# Patient Record
Sex: Female | Born: 1975
Health system: Southern US, Community
[De-identification: ages and names within clinical notes are randomized; demographics above are authoritative.]

## PROBLEM LIST (undated history)

## (undated) DIAGNOSIS — E039 Hypothyroidism, unspecified: Secondary | ICD-10-CM

## (undated) DIAGNOSIS — E063 Autoimmune thyroiditis: Secondary | ICD-10-CM

---

## 2019-06-19 ENCOUNTER — Ambulatory Visit (INDEPENDENT_AMBULATORY_CARE_PROVIDER_SITE_OTHER): Payer: 59

## 2019-06-19 ENCOUNTER — Ambulatory Visit (HOSPITAL_COMMUNITY)
Admission: EM | Admit: 2019-06-19 | Discharge: 2019-06-19 | Disposition: A | Discharge status: Home / Self Care | Payer: 59 | Attending: Emergency Medicine | Admitting: Emergency Medicine

## 2019-06-19 ENCOUNTER — Encounter (HOSPITAL_COMMUNITY): Payer: Self-pay | Admitting: Emergency Medicine

## 2019-06-19 DIAGNOSIS — S93492A Sprain of other ligament of left ankle, initial encounter: Secondary | ICD-10-CM

## 2019-06-19 DIAGNOSIS — S93602A Unspecified sprain of left foot, initial encounter: Secondary | ICD-10-CM | POA: Diagnosis not present

## 2019-06-19 DIAGNOSIS — S99922A Unspecified injury of left foot, initial encounter: Secondary | ICD-10-CM | POA: Diagnosis not present

## 2019-06-19 DIAGNOSIS — L989 Disorder of the skin and subcutaneous tissue, unspecified: Secondary | ICD-10-CM | POA: Diagnosis not present

## 2019-06-19 DIAGNOSIS — S91312A Laceration without foreign body, left foot, initial encounter: Secondary | ICD-10-CM | POA: Diagnosis not present

## 2019-06-19 DIAGNOSIS — S99912A Unspecified injury of left ankle, initial encounter: Secondary | ICD-10-CM | POA: Diagnosis not present

## 2019-06-19 HISTORY — DX: Autoimmune thyroiditis: E06.3

## 2019-06-19 HISTORY — DX: Hypothyroidism, unspecified: E03.9

## 2019-06-19 MED ORDER — KETOROLAC TROMETHAMINE 30 MG/ML IJ SOLN
INTRAMUSCULAR | Status: AC
  Filled 2019-06-19: qty 1

## 2019-06-19 MED ORDER — MUPIROCIN 2 % EX OINT: 1.0000 "application " | TOPICAL_OINTMENT | Freq: Three times a day (TID) | CUTANEOUS | 0 refills | Status: AC

## 2019-06-19 MED ORDER — IBUPROFEN 600 MG PO TABS: 600.0000 mg | ORAL_TABLET | Freq: Four times a day (QID) | ORAL | 0 refills | Status: AC | PRN

## 2019-06-19 MED ORDER — ACETAMINOPHEN 325 MG PO TABS
975.0000 mg | ORAL_TABLET | Freq: Once | ORAL | Status: AC
  Administered 2019-06-19: 975 mg via ORAL

## 2019-06-19 MED ORDER — ACETAMINOPHEN 325 MG PO TABS
ORAL_TABLET | ORAL | Status: AC
  Filled 2019-06-19: qty 3

## 2019-06-19 MED ORDER — KETOROLAC TROMETHAMINE 30 MG/ML IJ SOLN
30.0000 mg | Freq: Once | INTRAMUSCULAR | Status: AC
  Administered 2019-06-19: 30 mg via INTRAMUSCULAR

## 2019-06-19 MED FILL — MUPIROCIN 2% OINTMENT: 2 | 15 days supply | Qty: 22 | Fill #0

## 2019-06-19 MED FILL — IBUPROFEN 600 MG TABLET: 600 | 7 days supply | Qty: 30 | Fill #0
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
## 2019-06-19 NOTE — Discharge Instructions (Addendum)
Continue ice, elevation.  600 mg of ibuprofen combined with 1000 mg of Tylenol 3-4 times a day as needed for pain.  Wear the ASO as needed for comfort.  Apply the Bactroban and continue your essential oils and wound care on your hand and foot.  Follow-up with [facility] if your ankle and foot are still giving you problems in a week.  Please try and get some rest.

## 2019-10-23 ENCOUNTER — Other Ambulatory Visit (HOSPITAL_COMMUNITY): Payer: Self-pay | Admitting: Internal Medicine

## 2019-10-23 DIAGNOSIS — R002 Palpitations: Secondary | ICD-10-CM | POA: Diagnosis not present

## 2019-10-23 DIAGNOSIS — Z0000 Encounter for general adult medical examination without abnormal findings: Secondary | ICD-10-CM | POA: Diagnosis not present

## 2019-10-23 DIAGNOSIS — J45909 Unspecified asthma, uncomplicated: Secondary | ICD-10-CM | POA: Diagnosis not present

## 2019-10-23 DIAGNOSIS — E039 Hypothyroidism, unspecified: Secondary | ICD-10-CM | POA: Diagnosis not present

## 2019-10-23 DIAGNOSIS — R079 Chest pain, unspecified: Secondary | ICD-10-CM | POA: Diagnosis not present

## 2019-10-23 DIAGNOSIS — E041 Nontoxic single thyroid nodule: Secondary | ICD-10-CM | POA: Diagnosis not present

## 2019-10-23 MED FILL — PHENTERMINE 37.5 MG TABLET: 37.5 | 30 days supply | Qty: 30 | Fill #0

## 2019-10-23 MED FILL — ALBUTEROL SULFATE HFA 108 (: 108 (90 BAS | 50 days supply | Qty: 18 | Fill #0

## 2019-10-24 ENCOUNTER — Other Ambulatory Visit (HOSPITAL_COMMUNITY): Payer: Self-pay | Admitting: Internal Medicine

## 2019-10-24 DIAGNOSIS — R079 Chest pain, unspecified: Secondary | ICD-10-CM | POA: Diagnosis not present

## 2019-10-25 DIAGNOSIS — E042 Nontoxic multinodular goiter: Secondary | ICD-10-CM | POA: Diagnosis not present

## 2019-10-25 MED FILL — SYNTHROID 125 MCG TABLET: 125 | 90 days supply | Qty: 96 | Fill #0

## 2019-11-14 DIAGNOSIS — S39012A Strain of muscle, fascia and tendon of lower back, initial encounter: Secondary | ICD-10-CM | POA: Diagnosis not present

## 2019-11-14 DIAGNOSIS — M9901 Segmental and somatic dysfunction of cervical region: Secondary | ICD-10-CM | POA: Diagnosis not present

## 2019-11-14 DIAGNOSIS — M9903 Segmental and somatic dysfunction of lumbar region: Secondary | ICD-10-CM | POA: Diagnosis not present

## 2019-11-14 DIAGNOSIS — S134XXA Sprain of ligaments of cervical spine, initial encounter: Secondary | ICD-10-CM | POA: Diagnosis not present

## 2019-12-13 ENCOUNTER — Other Ambulatory Visit (HOSPITAL_COMMUNITY): Payer: Self-pay | Admitting: Internal Medicine

## 2019-12-13 DIAGNOSIS — E039 Hypothyroidism, unspecified: Secondary | ICD-10-CM | POA: Diagnosis not present

## 2019-12-13 DIAGNOSIS — Z7189 Other specified counseling: Secondary | ICD-10-CM | POA: Diagnosis not present

## 2019-12-13 DIAGNOSIS — Z6833 Body mass index (BMI) 33.0-33.9, adult: Secondary | ICD-10-CM | POA: Diagnosis not present

## 2019-12-13 MED FILL — PHENTERMINE 37.5 MG TABLET: 37.5 | 30 days supply | Qty: 30 | Fill #0

## 2019-12-29 ENCOUNTER — Encounter: Payer: Self-pay | Admitting: Physician Assistant

## 2020-01-03 ENCOUNTER — Ambulatory Visit (HOSPITAL_COMMUNITY)
Admission: RE | Admit: 2020-01-03 | Discharge: 2020-01-03 | Disposition: A | Discharge status: Home / Self Care | Payer: 59 | Source: Ambulatory Visit | Attending: Pulmonary Disease | Admitting: Pulmonary Disease

## 2020-01-03 ENCOUNTER — Encounter: Payer: Self-pay | Admitting: Oncology

## 2020-01-03 ENCOUNTER — Other Ambulatory Visit: Payer: Self-pay | Admitting: Oncology

## 2020-01-03 DIAGNOSIS — U071 COVID-19: Secondary | ICD-10-CM | POA: Diagnosis not present

## 2020-01-03 MED ORDER — SODIUM CHLORIDE 0.9 % IV SOLN
1200.0000 mg | Freq: Once | INTRAVENOUS | Status: AC
  Administered 2020-01-03: 1200 mg via INTRAVENOUS

## 2020-01-03 MED ORDER — FAMOTIDINE IN NACL 20-0.9 MG/50ML-% IV SOLN: 20.0000 mg | Freq: Once | INTRAVENOUS | Status: DC | PRN

## 2020-01-03 MED ORDER — ALBUTEROL SULFATE HFA 108 (90 BASE) MCG/ACT IN AERS: 2.0000 | INHALATION_SPRAY | Freq: Once | RESPIRATORY_TRACT | Status: DC | PRN

## 2020-01-03 MED ORDER — METHYLPREDNISOLONE SODIUM SUCC 125 MG IJ SOLR: 125.0000 mg | Freq: Once | INTRAMUSCULAR | Status: DC | PRN

## 2020-01-03 MED ORDER — SODIUM CHLORIDE 0.9 % IV SOLN: INTRAVENOUS | Status: DC | PRN

## 2020-01-03 MED ORDER — DIPHENHYDRAMINE HCL 50 MG/ML IJ SOLN: 50.0000 mg | Freq: Once | INTRAMUSCULAR | Status: DC | PRN

## 2020-01-03 MED ORDER — EPINEPHRINE 0.3 MG/0.3ML IJ SOAJ: 0.3000 mg | Freq: Once | INTRAMUSCULAR | Status: DC | PRN

## 2020-01-03 MED ORDER — SODIUM CHLORIDE 0.9 % IV SOLN: Freq: Once | INTRAVENOUS | Status: DC

## 2020-01-03 NOTE — Discharge Instructions (Signed)
10 Things You Can Do to Manage Your COVID-19 Symptoms at Home If you have possible or confirmed COVID-19: 1. Stay home from work and school. And stay away from other public places. If you must go out, avoid using any kind of public transportation, ridesharing, or taxis. 2. Monitor your symptoms carefully. If your symptoms get worse, call your healthcare provider immediately. 3. Get rest and stay hydrated. 4. If you have a medical appointment, call the healthcare provider ahead of time and tell them that you have or may have COVID-19. 5. For medical emergencies, call 911 and notify the dispatch personnel that you have or may have COVID-19. 6. Cover your cough and sneezes with a tissue or use the inside of your elbow. 7. Wash your hands often with soap and water for at least 20 seconds or clean your hands with an alcohol-based hand sanitizer that contains at least 60% alcohol. 8. As much as possible, stay in a specific room and away from other people in your home. Also, you should use a separate bathroom, if available. If you need to be around other people in or outside of the home, wear a mask. 9. Avoid sharing personal items with other people in your household, like dishes, towels, and bedding. 10. Clean all surfaces that are touched often, like counters, tabletops, and doorknobs. Use household cleaning sprays or wipes according to the label instructions. cdc.gov/coronavirus 07/25/2018 This information is not intended to replace advice given to you by your health care provider. Make sure you discuss any questions you have with your health care provider. Document Revised: 12/27/2018 Document Reviewed: 12/27/2018 Elsevier Patient Education  2020 Elsevier Inc. What types of side effects do monoclonal antibody drugs cause?  Common side effects  In general, the more common side effects caused by monoclonal antibody drugs include: . Allergic reactions, such as hives or itching . Flu-like signs and  symptoms, including chills, fatigue, fever, and muscle aches and pains . Nausea, vomiting . Diarrhea . Skin rashes . Low blood pressure   The CDC is recommending patients who receive monoclonal antibody treatments wait at least 90 days before being vaccinated.  Currently, there are no data on the safety and efficacy of mRNA COVID-19 vaccines in persons who received monoclonal antibodies or convalescent plasma as part of COVID-19 treatment. Based on the estimated half-life of such therapies as well as evidence suggesting that reinfection is uncommon in the 90 days after initial infection, vaccination should be deferred for at least 90 days, as a precautionary measure until additional information becomes available, to avoid interference of the antibody treatment with vaccine-induced immune responses. If you have any questions or concerns after the infusion please call the Advanced Practice Provider on call at [phone]. This number is ONLY intended for your use regarding questions or concerns about the infusion post-treatment side-effects.  Please do not provide this number to others for use. For return to work notes please contact your primary care provider.   If someone you know is interested in receiving treatment please have them call the COVID hotline at [phone].   

## 2020-01-03 NOTE — Progress Notes (Signed)
  Diagnosis: COVID-19  Physician: Patrick Wright, MD  Procedure: [facility] Infusion Clinic Med: casirivimab\imdevimab infusion - Provided patient with casirivimab\imdevimab fact sheet for patients, parents and caregivers prior to infusion.  Complications: No immediate complications noted.  Discharge: Discharged home   [name] N Geoff Dacanay 01/03/2020  

## 2020-01-03 NOTE — Progress Notes (Signed)
I connected by phone with  Kimberly Waller to discuss the potential use of an new treatment for mild to moderate COVID-19 viral infection in non-hospitalized patients.   This patient is a age/sex that meets the FDA criteria for Emergency Use Authorization of casirivimab\imdevimab.  Has a (+) direct SARS-CoV-2 viral test result 1. Has mild or moderate COVID-19  2. Is ? 45 years of age and weighs ? 40 kg 3. Is NOT hospitalized due to COVID-19 4. Is NOT requiring oxygen therapy or requiring an increase in baseline oxygen flow rate due to COVID-19 5. Is within 10 days of symptom onset 6. Has at least one of the high risk factor(s) for progression to severe COVID-19 and/or hospitalization as defined in EUA. Specific high risk criteria : Past Medical History:  Diagnosis Date  . Hashimoto's disease   . Hypothyroidism   ?  ?    Symptom onset  12/25/2019   I have spoken and communicated the following to the patient or parent/caregiver:   1. FDA has authorized the emergency use of casirivimab\imdevimab for the treatment of mild to moderate COVID-19 in adults and pediatric patients with positive results of direct SARS-CoV-2 viral testing who are 68 years of age and older weighing at least 40 kg, and who are at high risk for progressing to severe COVID-19 and/or hospitalization.   2. The significant known and potential risks and benefits of casirivimab\imdevimab, and the extent to which such potential risks and benefits are unknown.   3. Information on available alternative treatments and the risks and benefits of those alternatives, including clinical trials.   4. Patients treated with casirivimab\imdevimab should continue to self-isolate and use infection control measures (e.g., wear mask, isolate, social distance, avoid sharing personal items, clean and disinfect "high touch" surfaces, and frequent handwashing) according to CDC guidelines.    5. The patient or parent/caregiver has the option to  accept or refuse casirivimab\imdevimab .   After reviewing this information with the patient, The patient agreed to proceed with receiving casirivimab\imdevimab infusion and will be provided a copy of the Fact sheet prior to receiving the infusion.Mignon Pine, AGNP-C [phone] (Infusion Center Hotline)

## 2020-01-03 NOTE — Progress Notes (Signed)
Patient reviewed Fact Sheet for Patients, Parents, and Caregivers for Emergency Use Authorization (EUA) of REGEN-COV for the Treatment of Coronavirus. Patient also reviewed and is agreeable to the estimated cost of treatment. Patient is agreeable to proceed.    

## 2020-01-28 MED FILL — SYNTHROID 125 MCG TABLET: 125 | 90 days supply | Qty: 96 | Fill #1

## 2020-03-16 ENCOUNTER — Other Ambulatory Visit (HOSPITAL_COMMUNITY): Payer: Self-pay | Admitting: Internal Medicine

## 2020-03-16 DIAGNOSIS — E039 Hypothyroidism, unspecified: Secondary | ICD-10-CM | POA: Diagnosis not present

## 2020-03-16 DIAGNOSIS — Z7189 Other specified counseling: Secondary | ICD-10-CM | POA: Diagnosis not present

## 2020-03-16 DIAGNOSIS — M542 Cervicalgia: Secondary | ICD-10-CM | POA: Diagnosis not present

## 2020-03-16 DIAGNOSIS — Z6833 Body mass index (BMI) 33.0-33.9, adult: Secondary | ICD-10-CM | POA: Diagnosis not present

## 2020-03-16 DIAGNOSIS — Z6834 Body mass index (BMI) 34.0-34.9, adult: Secondary | ICD-10-CM | POA: Diagnosis not present

## 2020-03-16 MED FILL — CYCLOBENZAPRINE HCL 10 MG T: 10 | 5 days supply | Qty: 15 | Fill #0

## 2020-03-17 MED FILL — ALBUTEROL SULFATE HFA 108 (: 108 (90 BAS | 50 days supply | Qty: 18 | Fill #1

## 2020-03-17 MED FILL — PHENTERMINE 37.5 MG TABLET: 37.5 | 30 days supply | Qty: 30 | Fill #0

## 2020-06-10 ENCOUNTER — Other Ambulatory Visit (HOSPITAL_COMMUNITY): Payer: Self-pay

## 2020-06-10 MED FILL — Levothyroxine Sodium Tab 125 MCG: ORAL | 28 days supply | Qty: 32 | Fill #0 | Status: AC

## 2020-11-27 ENCOUNTER — Other Ambulatory Visit (HOSPITAL_COMMUNITY): Payer: Self-pay

## 2021-12-27 IMAGING — DX DG FOOT COMPLETE 3+V*L*
3 series · 3 of 3 positions shown · non-contrast
Comparison: None.

CLINICAL DATA: Distal fibular tenderness, fourth digit pain, trauma

EXAM:
LEFT FOOT - COMPLETE 3+ VIEW

[foot ap]
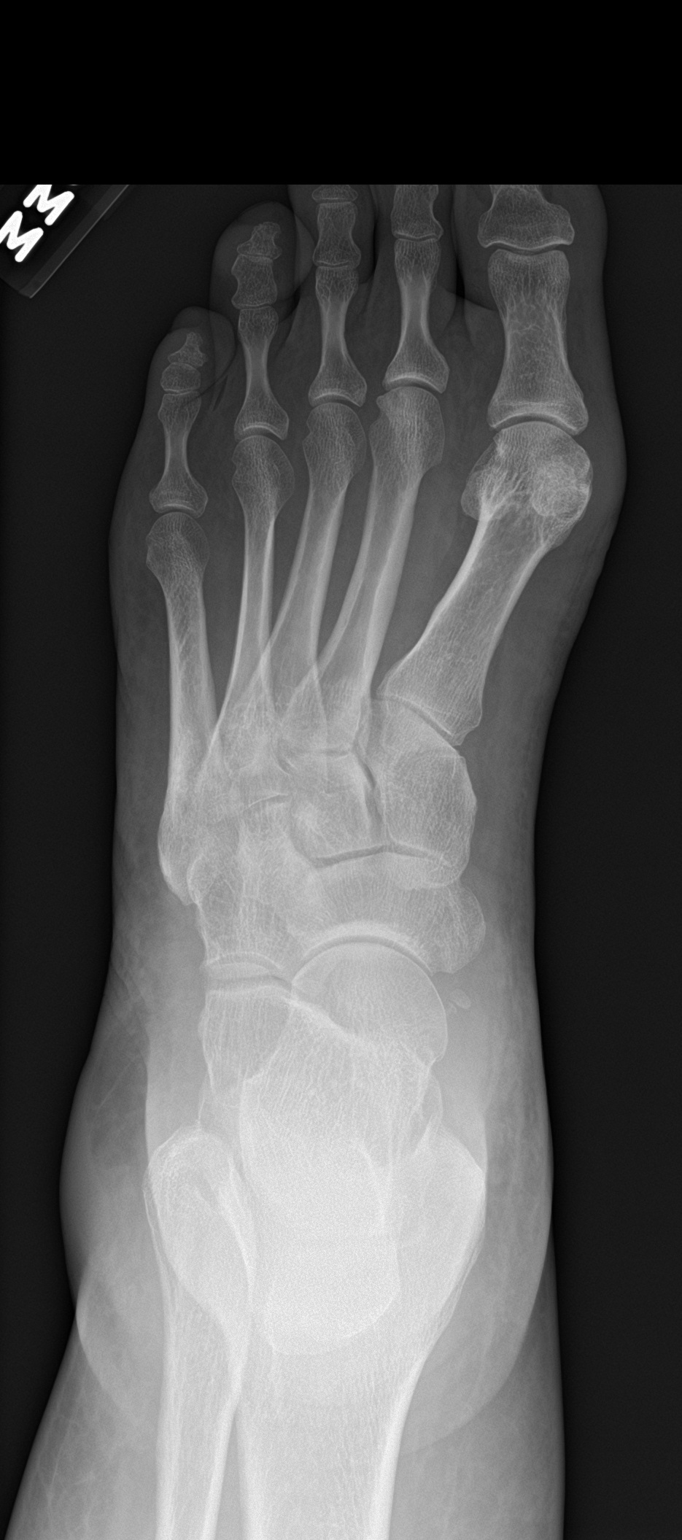

[foot obl]
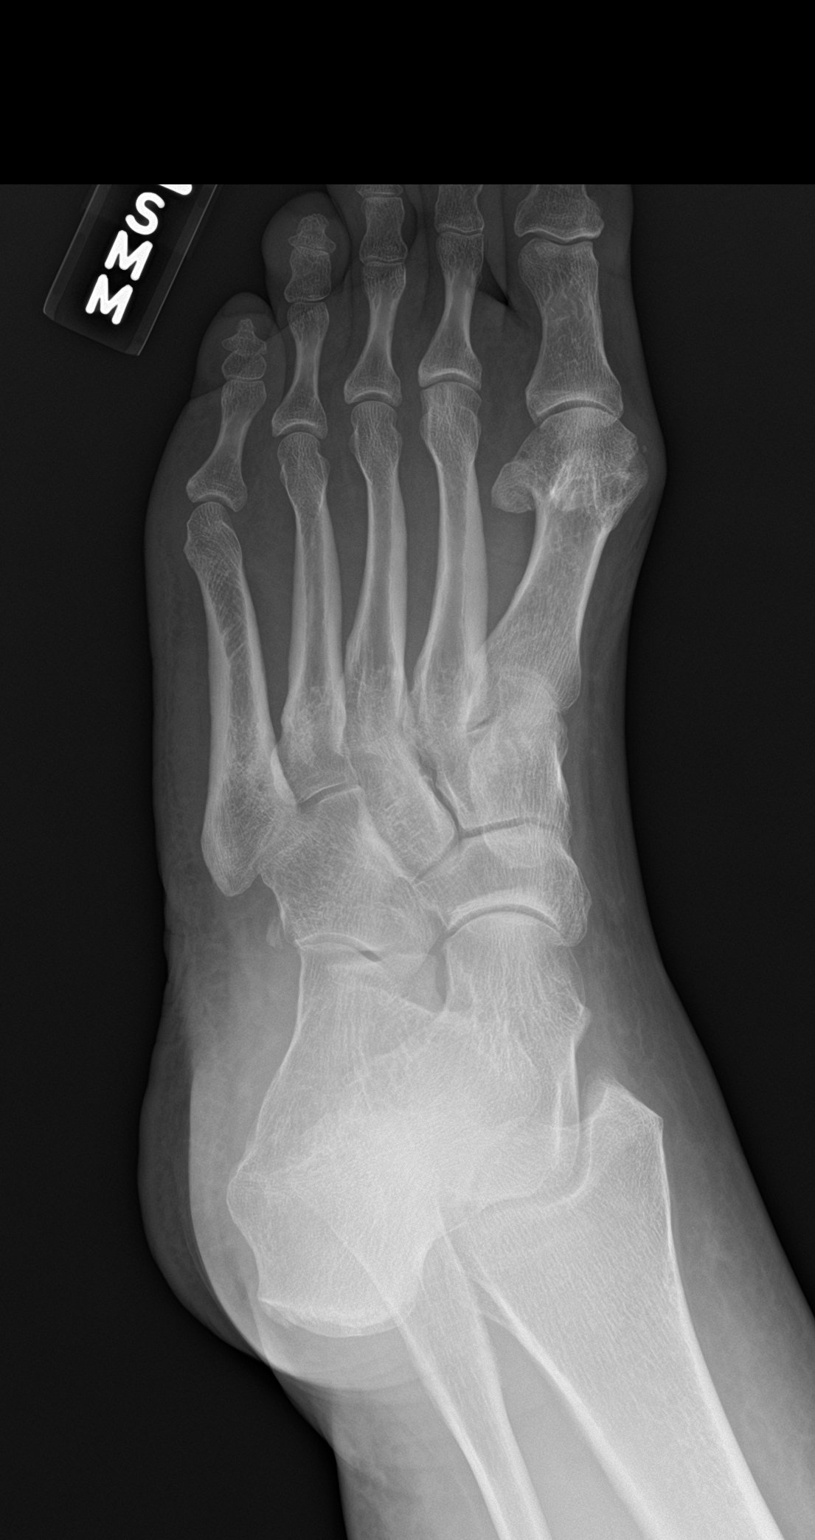

[foot lat]
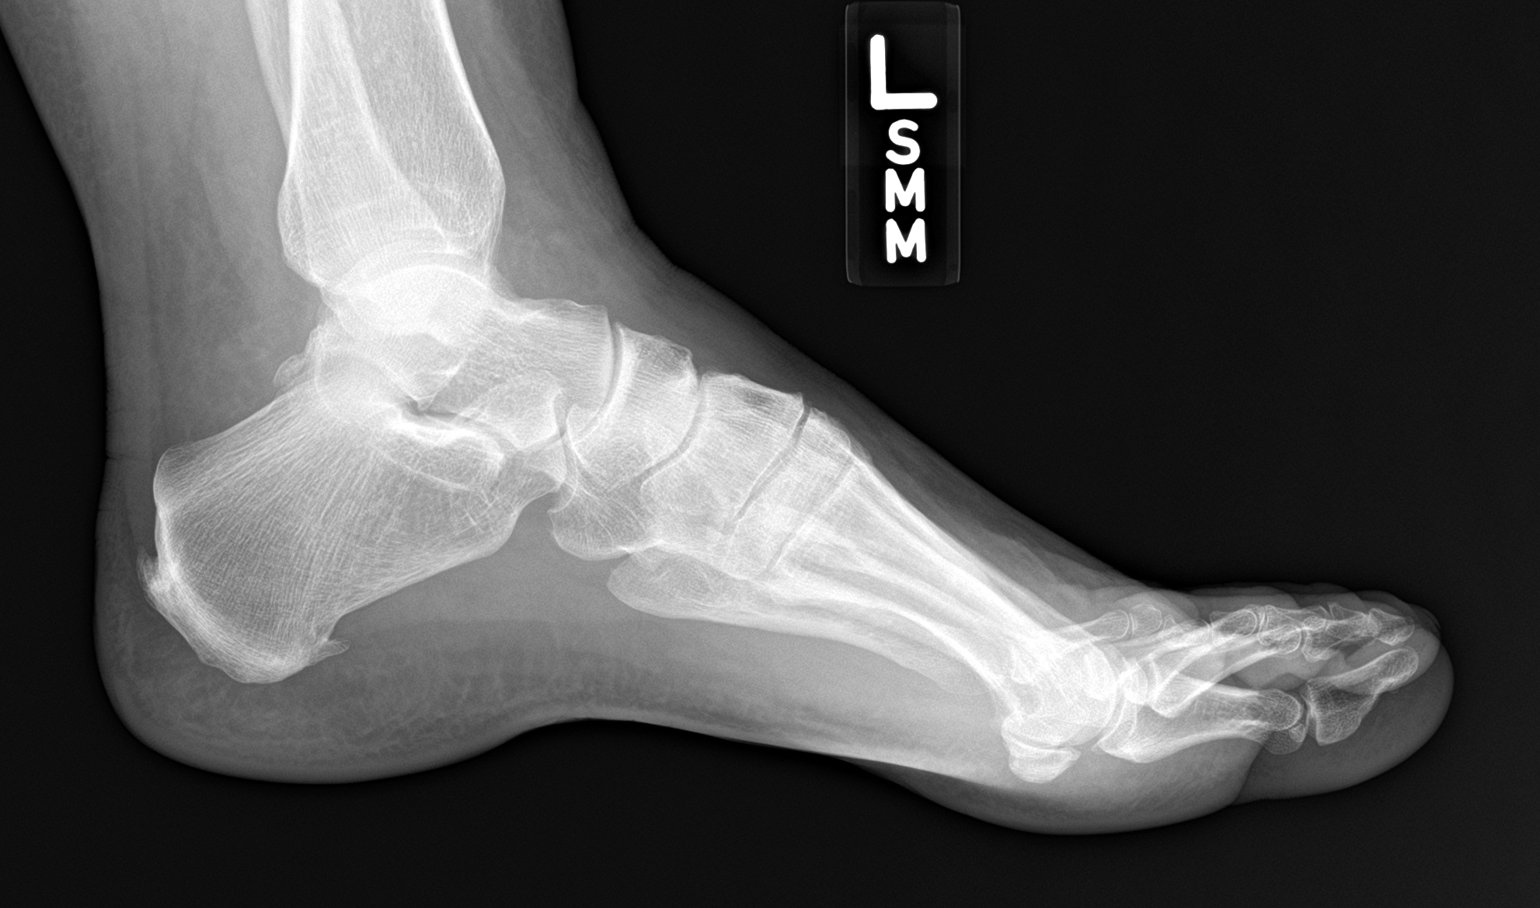

[3 of 3 positions shown; findings below may reference images not displayed]

FINDINGS: Frontal, oblique, and lateral views of the left foot are obtained.
No fracture, subluxation, or dislocation. There are small calcaneal
spurs. Soft tissues are unremarkable.
IMPRESSION: 1. No acute bony abnormality.
